# Patient Record
Sex: Female | Born: 1995 | Hispanic: No | Smoking: Never smoker
Health system: Southern US, Community
[De-identification: ages and names within clinical notes are randomized; demographics above are authoritative.]

## PROBLEM LIST (undated history)

## (undated) ENCOUNTER — Inpatient Hospital Stay (HOSPITAL_COMMUNITY): Payer: Self-pay

## (undated) DIAGNOSIS — Z789 Other specified health status: Secondary | ICD-10-CM

## (undated) HISTORY — PX: NO PAST SURGERIES: SHX2092

---
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
## 2015-07-07 IMAGING — US US OB COMP +14 WK
1 series · 12 of 28 positions shown · non-contrast
Comparison: none

[Series 1: us ob comp +14 wk · 12 of 71 slices shown]
[im 3/71]
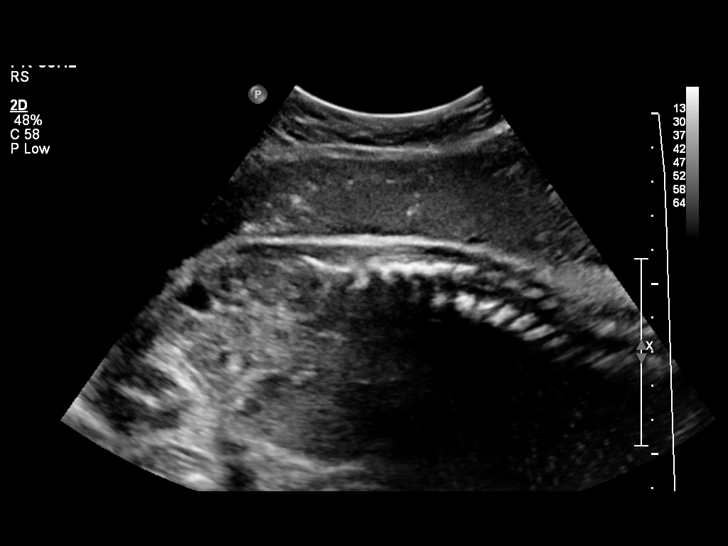
[im 8/71]
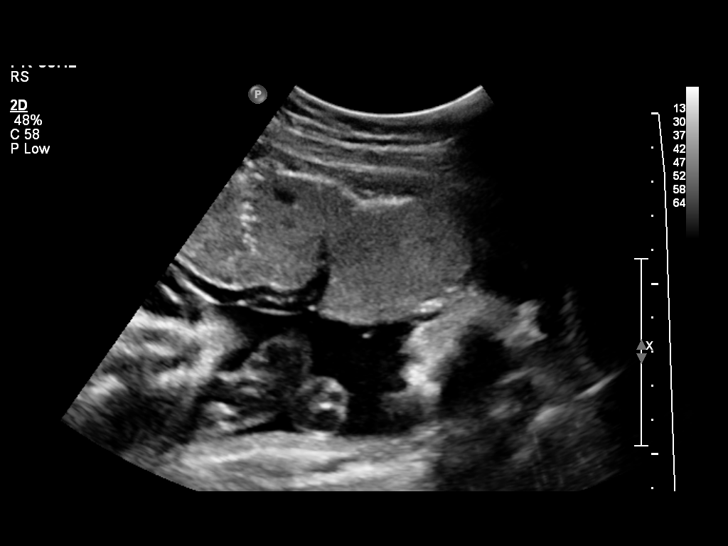
[im 13/71]
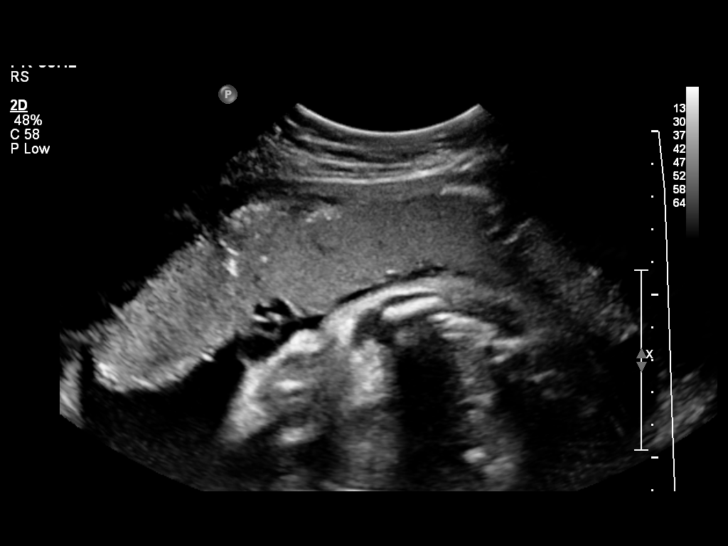
[im 21/71]
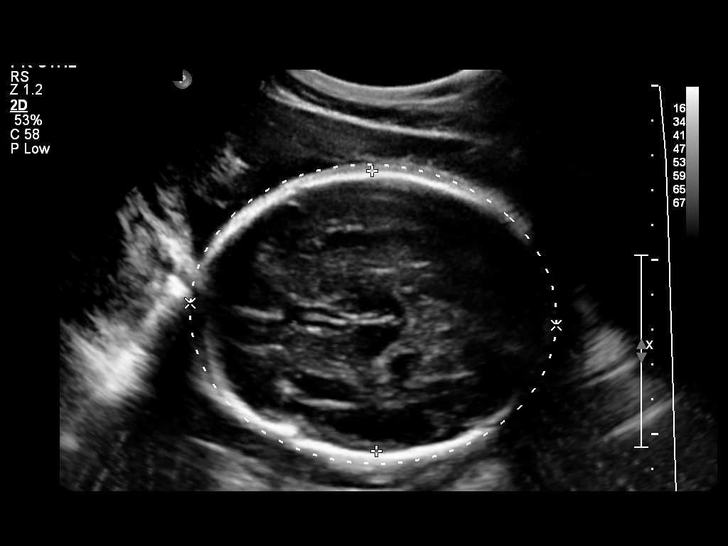
[im 26/71]
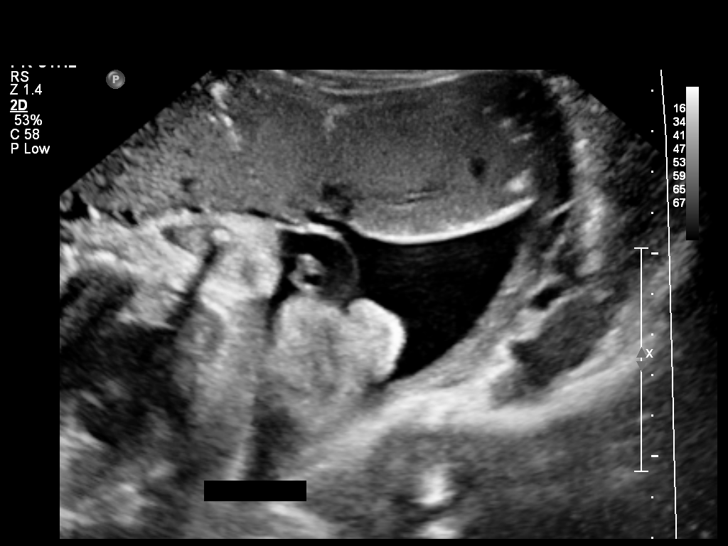
[im 32/71]
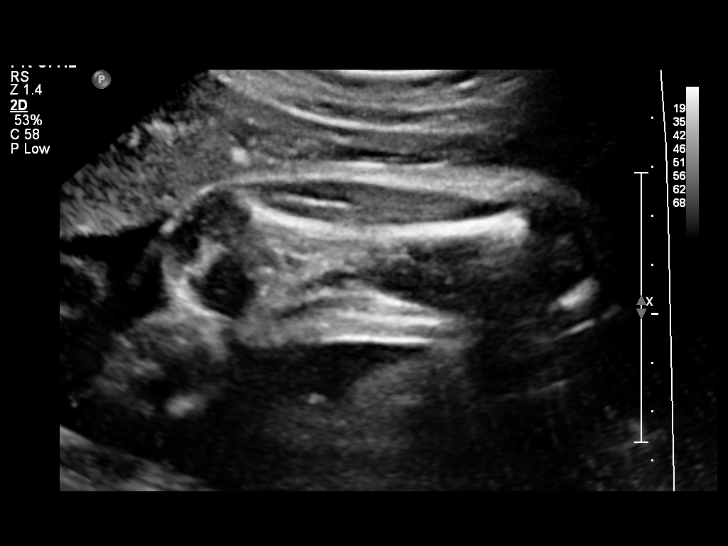
[im 39/71]
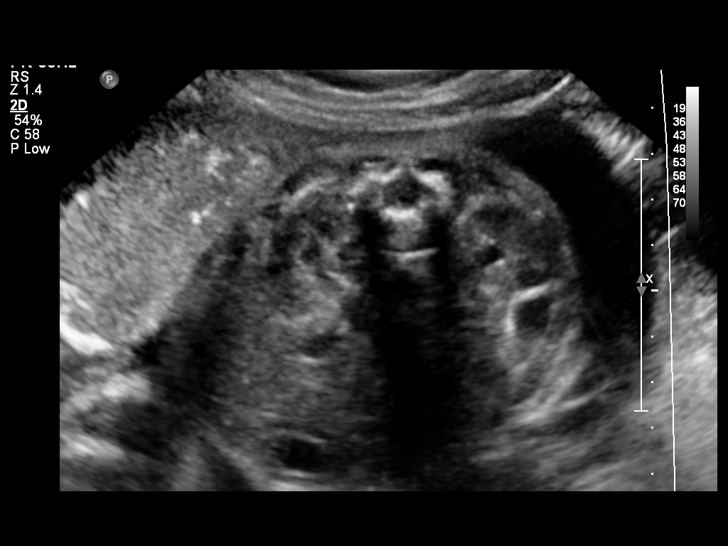
[im 45/71]
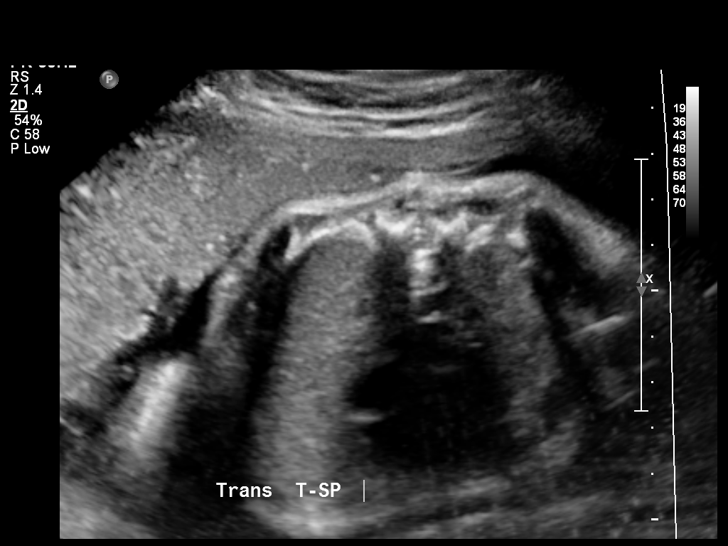
[im 50/71]
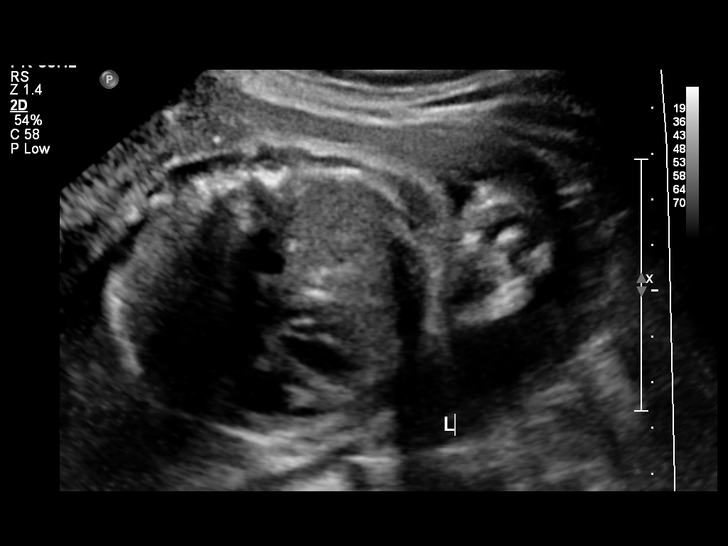
[im 58/71]
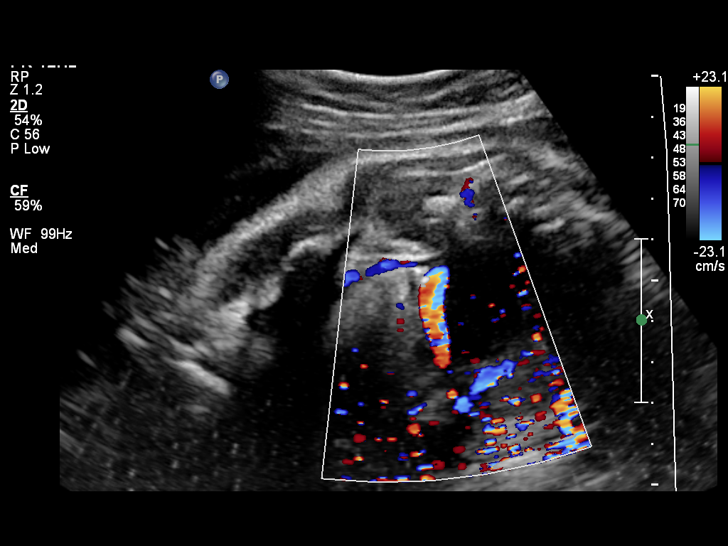
[im 63/71]
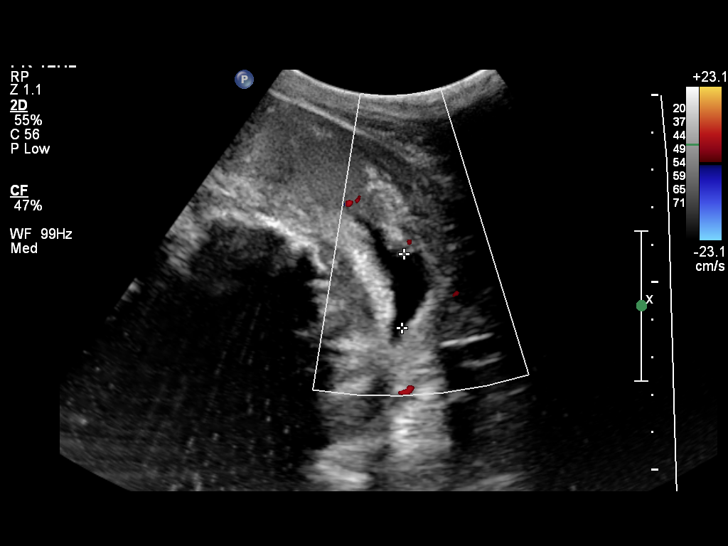
[im 68/71]
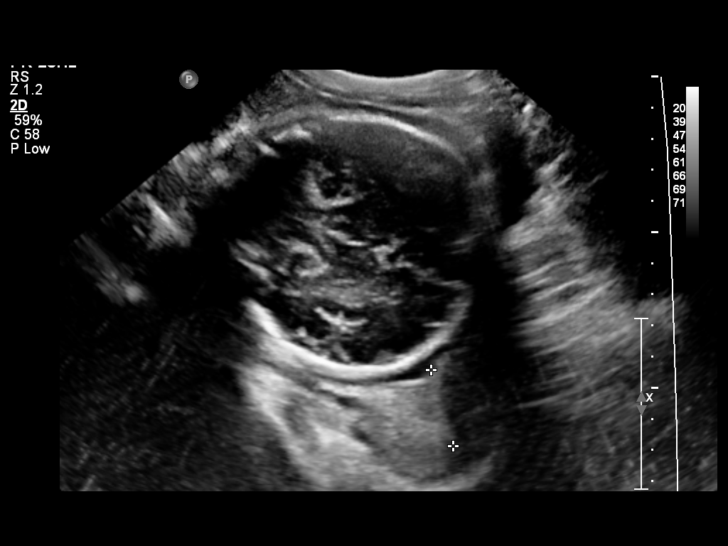

[12 of 28 positions shown; findings below may reference images not displayed]

OBSTETRICS REPORT
                      (Signed Final 11/24/2013 [DATE])

Service(s) Provided

 US OB COMP + 14 WK                                    76805.1
Indications

 No or Little Prenatal Care
 Advanced cervical dilatation
 Unsure of LMP;  Establish Gestational [AGE]
 Abdominal pain - generalized
Fetal Evaluation

 Num Of Fetuses:    1
 Fetal Heart Rate:  139                          bpm
 Cardiac Activity:  Observed
 Presentation:      Cephalic
 Placenta:          Anterior, above cervical os
 P. Cord            Visualized
 Insertion:

 Amniotic Fluid
 AFI FV:      Subjectively within normal limits
 AFI Sum:     12.57   cm       36  %Tile     Larg Pckt:    4.21  cm
 RUQ:   3.25    cm   RLQ:    4.21   cm    LUQ:   3.15    cm   LLQ:    1.96   cm
Biometry

 BPD:     80.5  mm     G. Age:  32w 2d                CI:         73.9   70 - 86
                                                      FL/HC:      19.4   19.3 -

 HC:     297.4  mm     G. Age:  33w 0d       54  %    HC/AC:      1.15   0.96 -

 AC:     257.9  mm     G. Age:  30w 0d       11  %    FL/BPD:     71.8   71 - 87
 FL:      57.8  mm     G. Age:  30w 2d       11  %    FL/AC:      22.4   20 - 24
 HUM:     53.1  mm     G. Age:  31w 0d       41  %

 Est. FW:    4430  gm      3 lb 8 oz     37  %
Gestational Age

 LMP:           21w 0d        Date:  06/30/13                 EDD:   04/06/14
 U/S Today:     31w 3d                                        EDD:   01/23/14
 Best:          31w 3d     Det. By:  U/S (11/24/13)           EDD:   01/23/14
Anatomy
 Cranium:          Appears normal         Aortic Arch:      Not well visualized
 Fetal Cavum:      Appears normal         Ductal Arch:      Not well visualized
 Ventricles:       Appears normal         Diaphragm:        Appears normal
 Choroid Plexus:   Not well visualized    Stomach:          Appears normal, left
                                                            sided
 Cerebellum:       Appears normal         Abdomen:          Not well visualized
 Posterior Fossa:  Not well visualized    Abdominal Wall:   Not well visualized
 Nuchal Fold:      Not applicable (>20    Cord Vessels:     Appears normal (3
                   wks GA)                                  vessel cord)
 Face:             Not well visualized    Kidneys:          Appear normal
 Lips:             Appears normal         Bladder:          Appears normal
 Heart:            Not well visualized    Spine:            Appears normal
 RVOT:             Not well visualized    Lower             Visualized
                                          Extremities:
 LVOT:             Not well visualized    Upper             Visualized
                                          Extremities:

 Other:  Technically difficult due to advanced GA and fetal position.
Cervix Uterus Adnexa

 Cervical Length:    2.98     cm

 Cervix:       Normal appearance by transabdominal scan.
 Uterus:       No abnormality visualized.
 Cul De Sac:   No free fluid seen.
 Left Ovary:    Not visualized.
 Right Ovary:   Not visualized.

 Adnexa:     No abnormality visualized.
Impression

 Single IUP at 31w 3d
 Limited/ late prenatal care, advanced cervical dilation
 Limited views of the fetal anatomy were obtained due to late
 gestational age
 No gross anomalies were noted
 Fetal growth appears appropriate (37th %tile).  The AC
 measures at the 11th %tile.
 Normal amniotic fluid volume
Recommendations

 Recommend follow-up ultrasound examination in 3-4 weeks if
 undelivered for growth and to reevaluate the fetal anatomy

 questions or concerns.

## 2015-07-07 IMAGING — US US ABDOMEN LIMITED
1 series · 14 of 25 positions shown · non-contrast
Comparison: None.

CLINICAL DATA: Abdominal pain and diarrhea

EXAM:
US ABDOMEN LIMITED - RIGHT UPPER QUADRANT

[Series 1: us abdomen limited ruq/ascites · 14 of 40 slices shown]
[im 1/40]
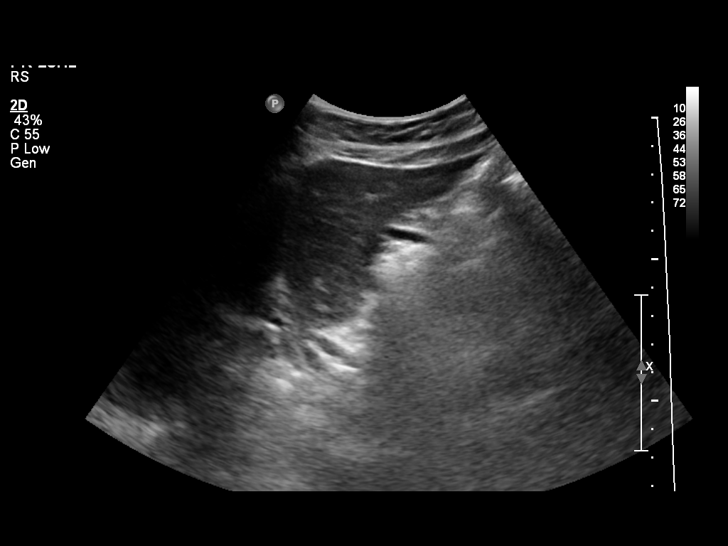
[im 4/40]
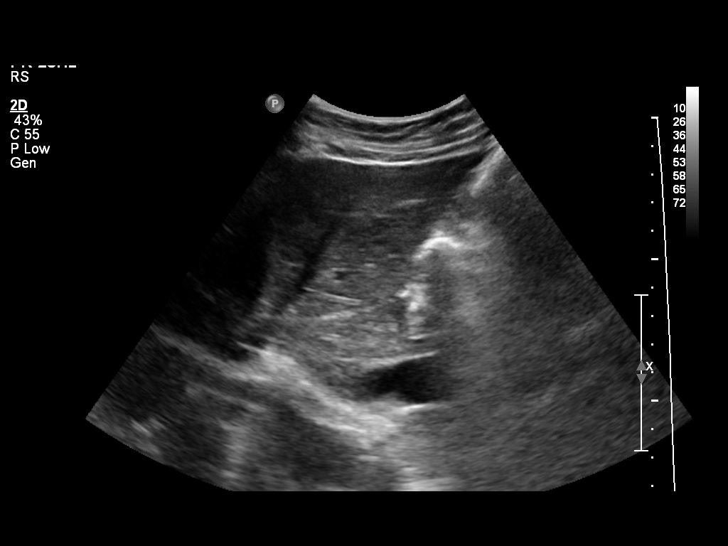
[im 7/40]
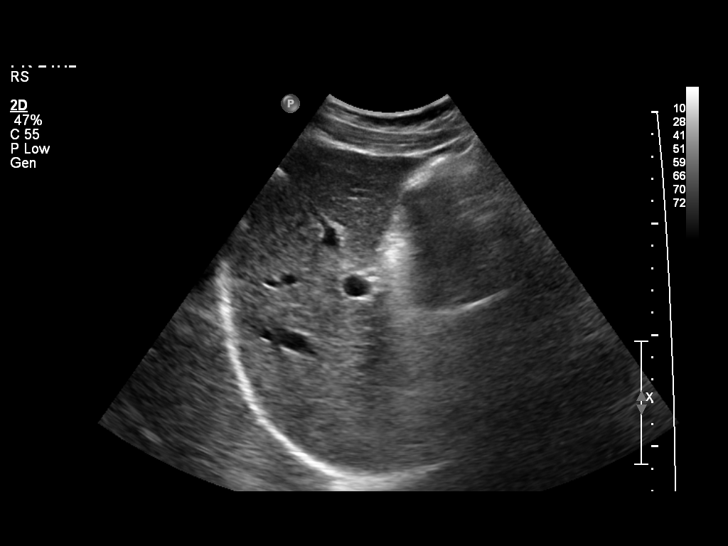
[im 10/40]
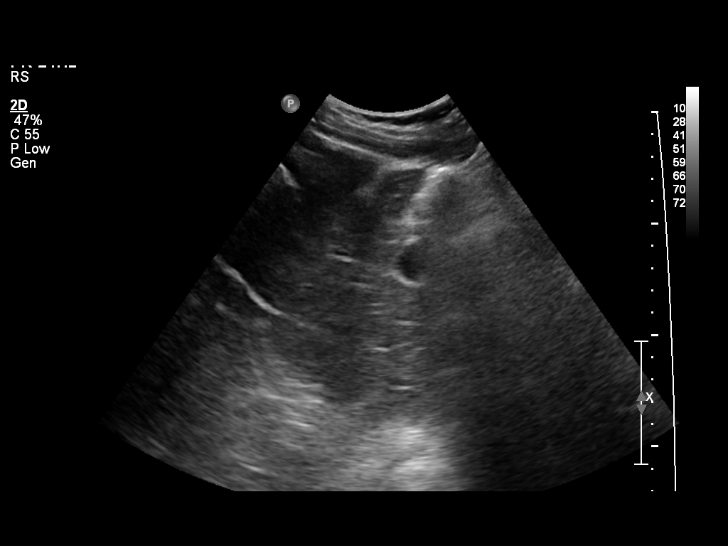
[im 14/40]
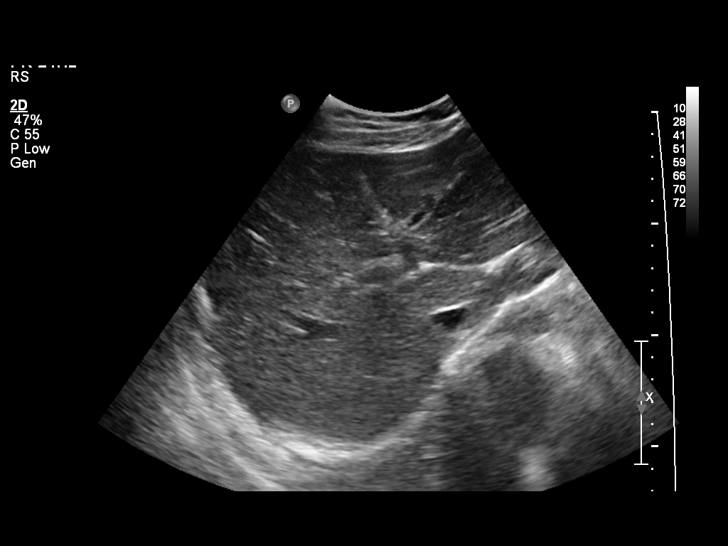
[im 15/40]
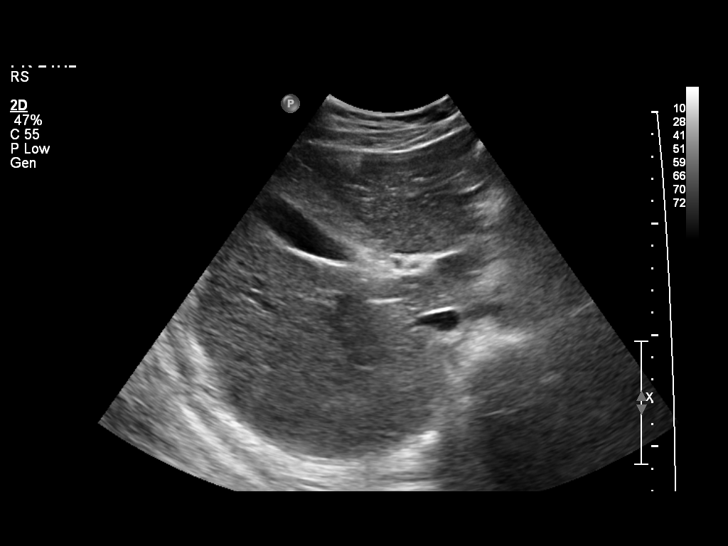
[im 18/40]
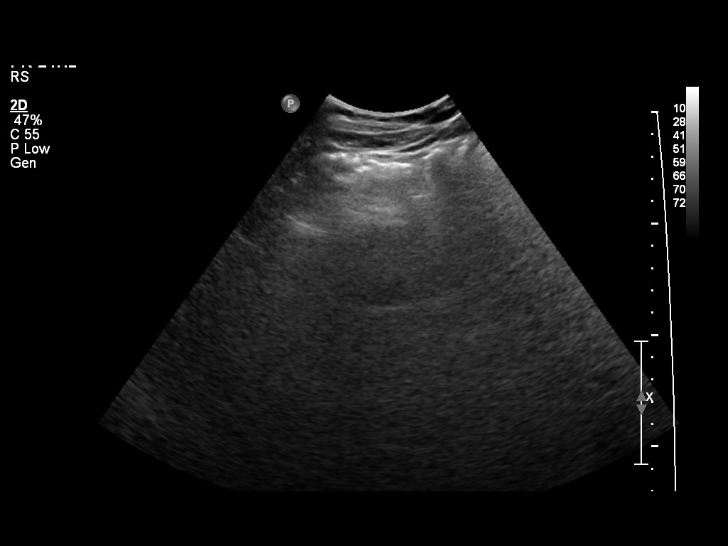
[im 22/40]
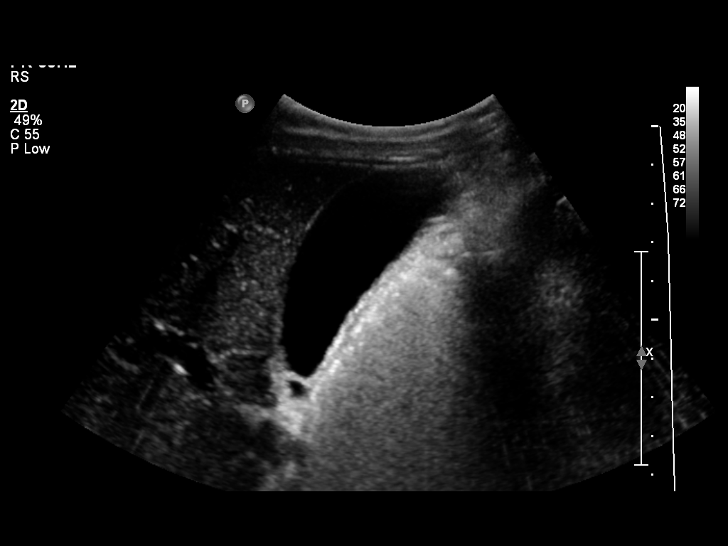
[im 25/40]
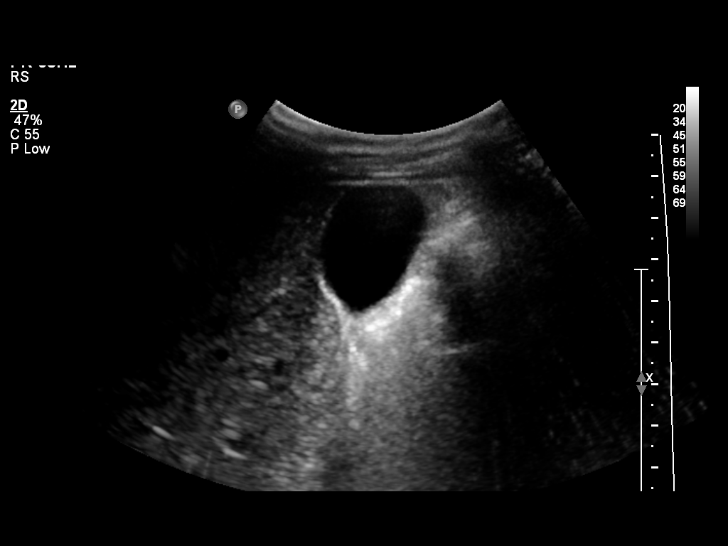
[im 27/40]
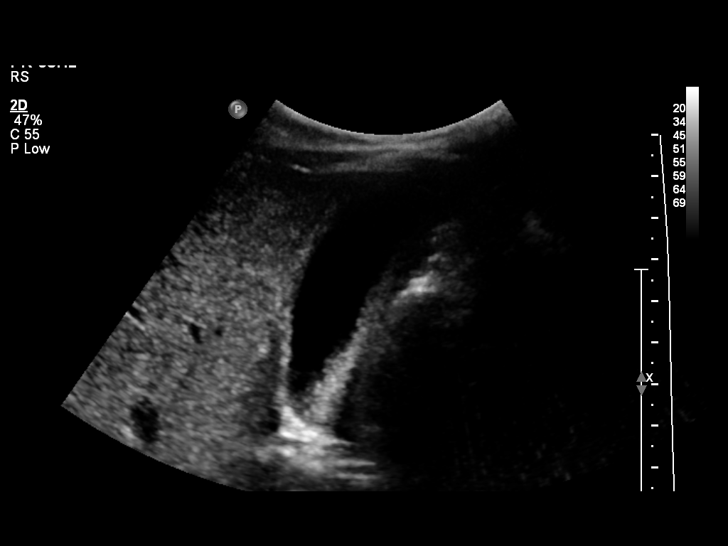
[im 30/40]
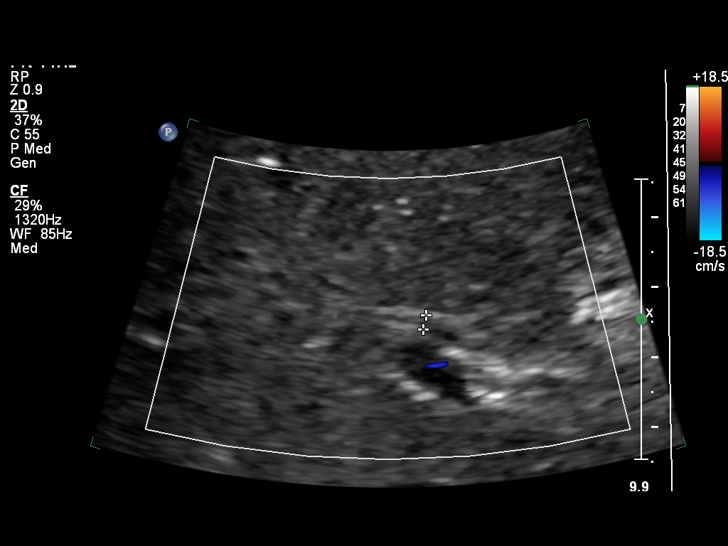
[im 33/40]
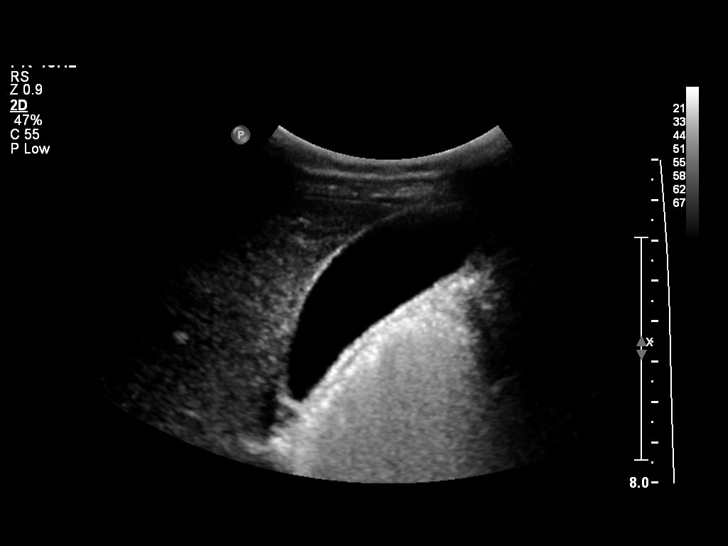
[im 36/40]
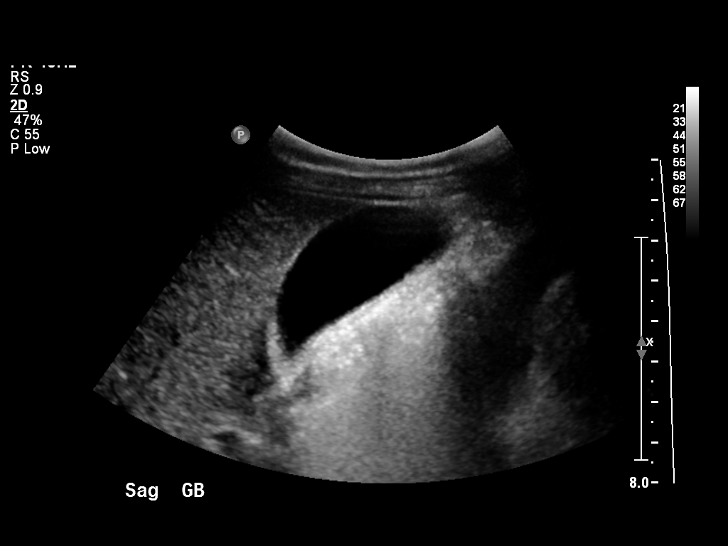
[im 40/40]
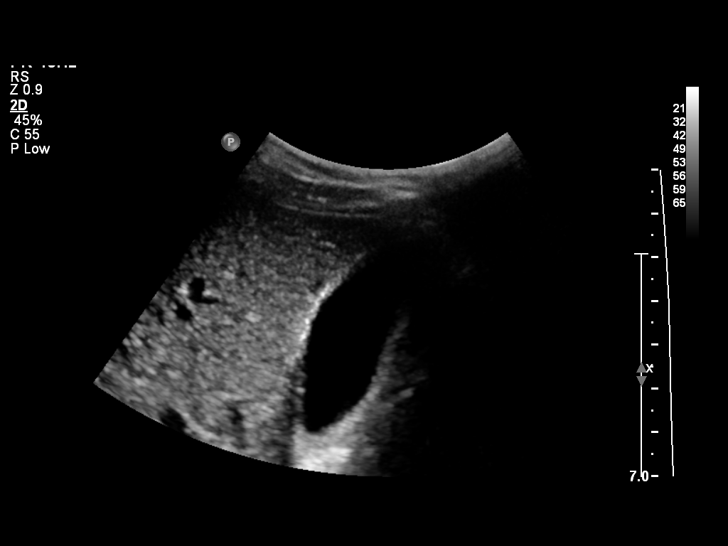

[14 of 25 positions shown; findings below may reference images not displayed]

FINDINGS: Gallbladder:

No gallstones or wall thickening visualized. There is no
pericholecystic fluid. No sonographic Murphy sign noted.

Common bile duct:

Diameter: 2 mm. There is no intrahepatic or extrahepatic biliary
duct dilatation.

Liver:

No focal lesion identified. Within normal limits in parenchymal
echogenicity.
IMPRESSION: Study within normal limits.

## 2015-08-20 IMAGING — US US OB FOLLOW-UP
1 series · 12 of 28 positions shown · non-contrast
Comparison: none

[Series 1: us ob follow up · 65 acquisitions, 12 frames shown]
[im 3/65]
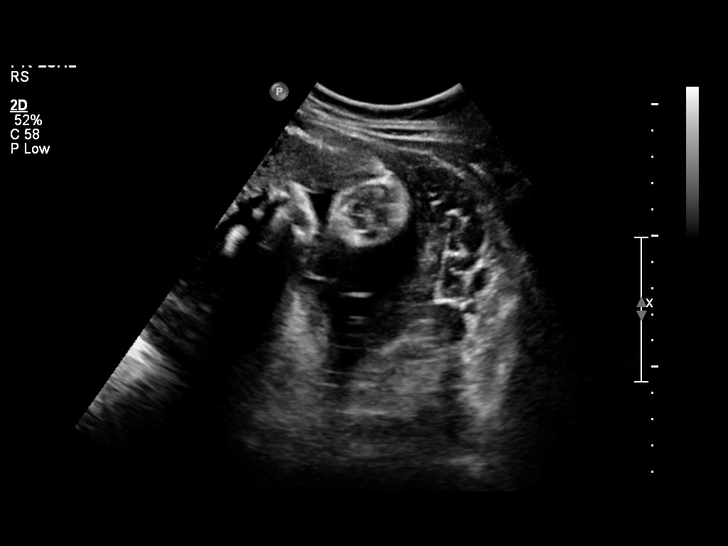
[im 8/65]
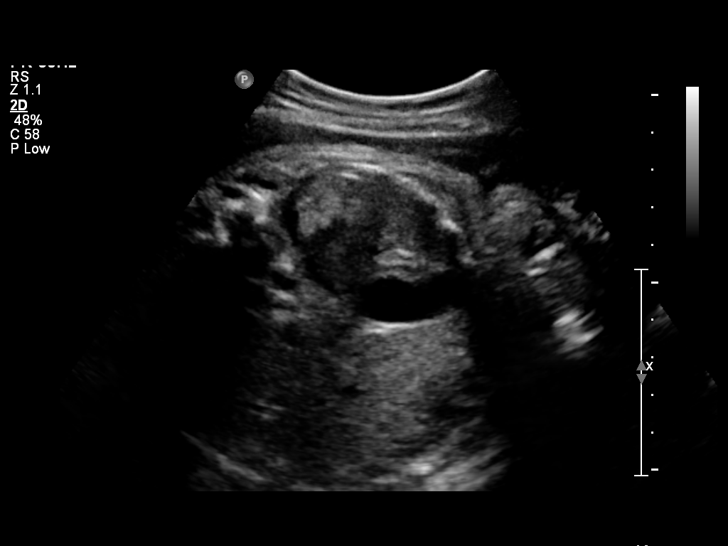
[im 12/65]
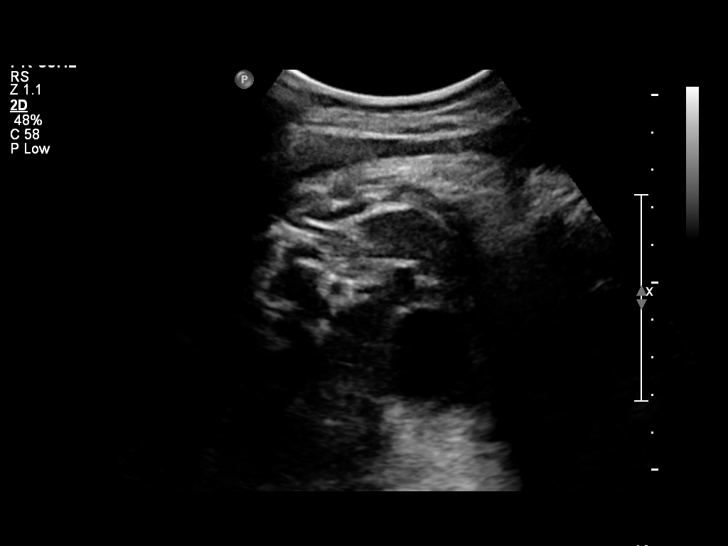
[im 19/65]
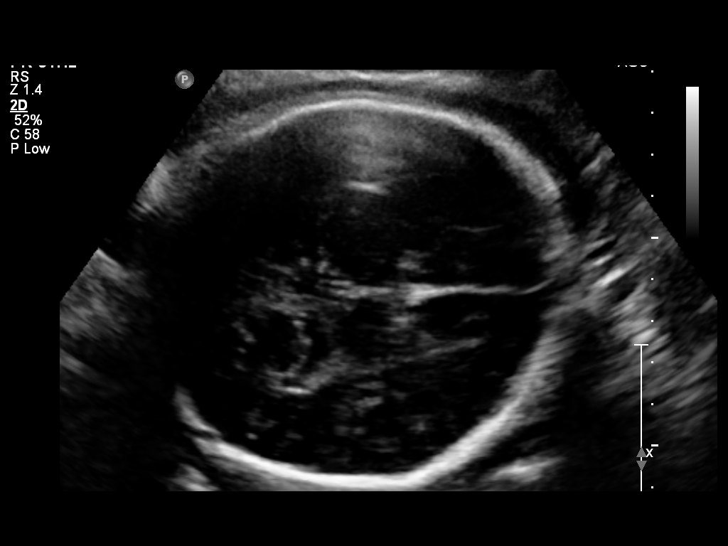
[im 24/65]
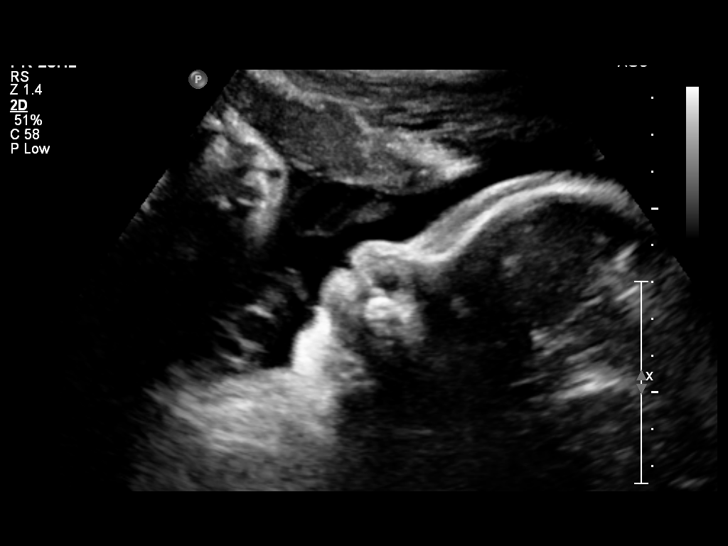
[im 29/65]
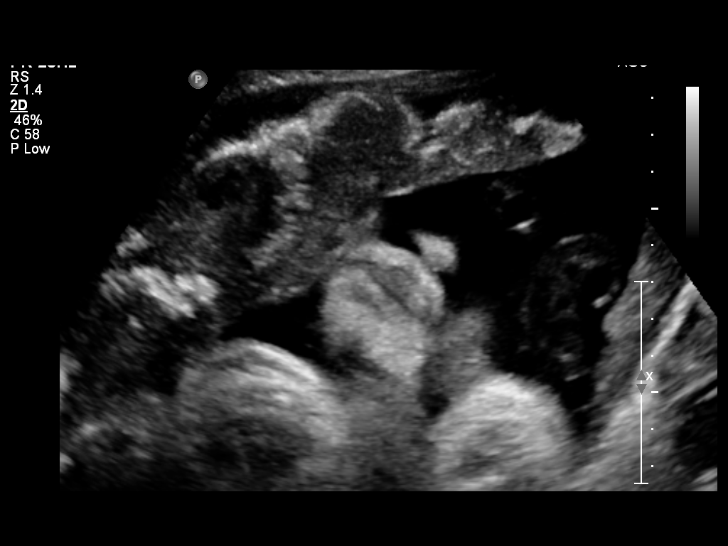
[im 36/65]
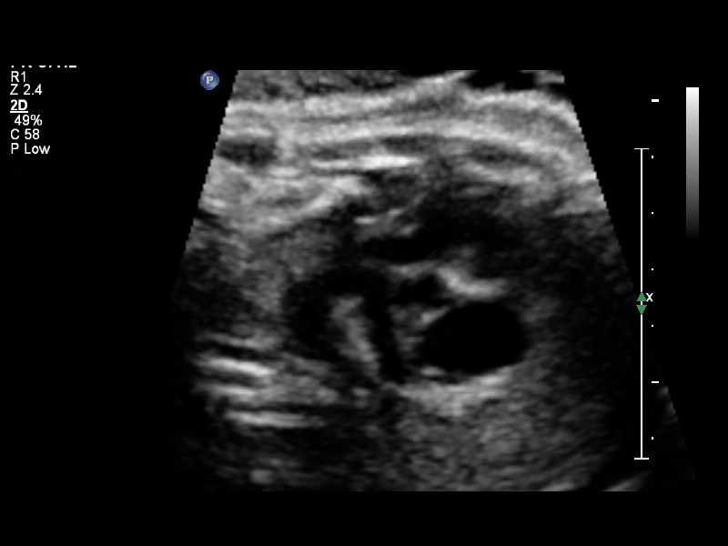
[im 41/65]
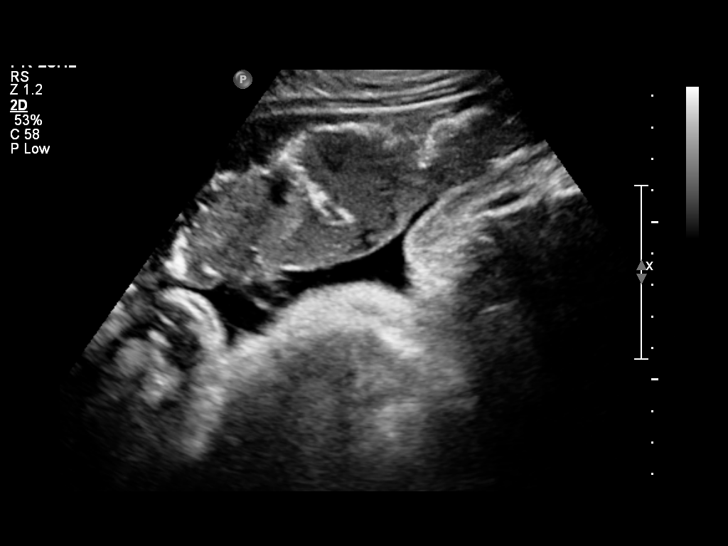
[im 46/65]
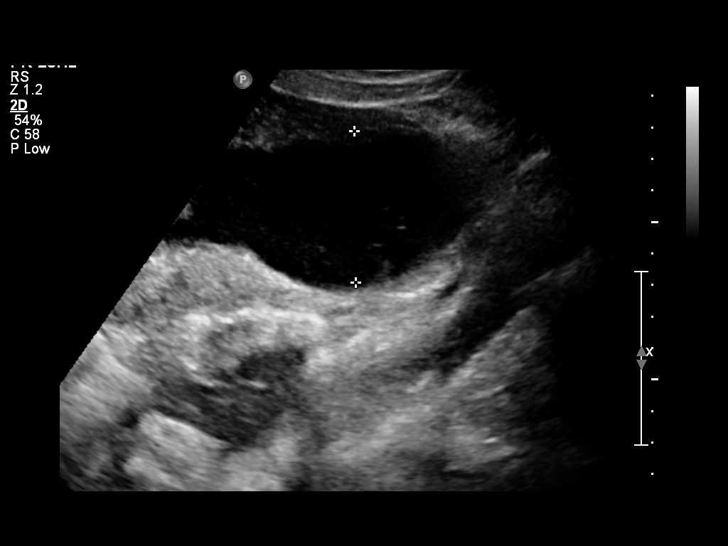
[im 53/65]
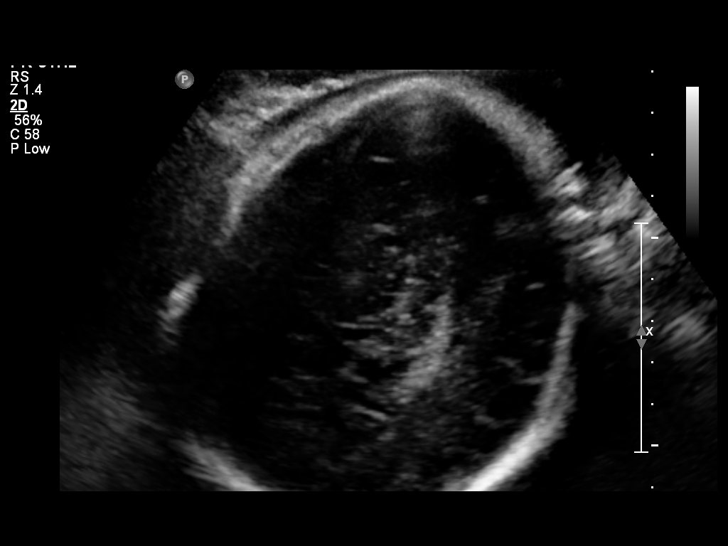
[im 57/65]
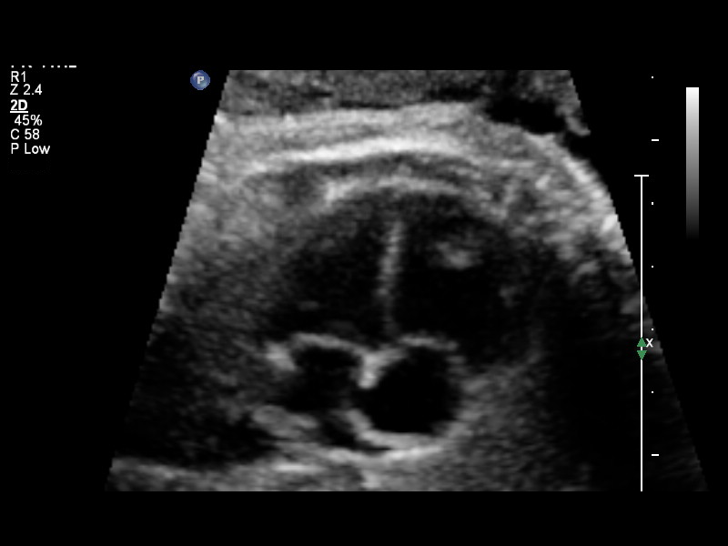
[im 62/65]
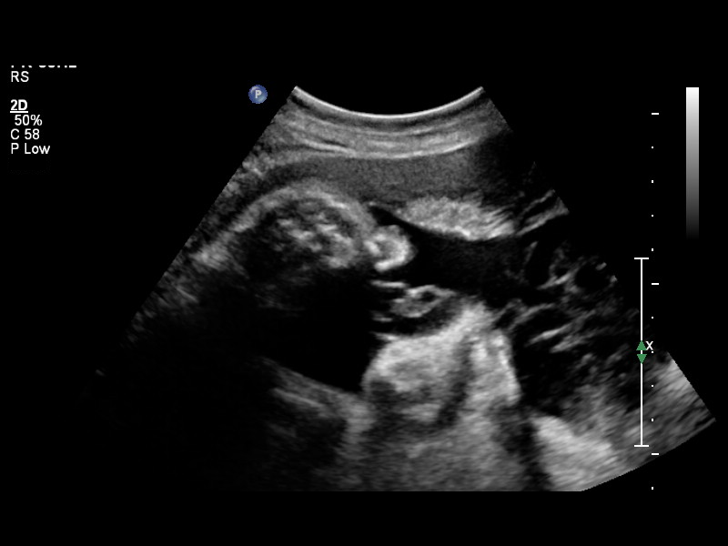

[12 of 28 positions shown; findings below may reference images not displayed]

OBSTETRICS REPORT
                      (Signed Final 01/07/2014 [DATE])

Service(s) Provided

 US OB FOLLOW UP                                       76816.1
Indications

 No or Little Prenatal Care
 Advanced cervical dilatation
 Teen pregnancy
 Follow-up incomplete fetal anatomic evaluation
Fetal Evaluation

 Num Of Fetuses:    1
 Fetal Heart Rate:  134                          bpm
 Cardiac Activity:  Observed
 Presentation:      Cephalic
 Placenta:          Anterior, above cervical os
 P. Cord            Previously Visualized
 Insertion:

 Amniotic Fluid
 AFI FV:      Subjectively within normal limits
 AFI Sum:     13.1    cm       49  %Tile     Larg Pckt:    4.79  cm
 RUQ:   1.25    cm   RLQ:    3.67   cm    LUQ:   3.39    cm   LLQ:    4.79   cm
Biometry

 BPD:     88.6  mm     G. Age:  35w 6d                CI:        76.05   70 - 86
                                                      FL/HC:      22.1   20.9 -

 HC:       322  mm     G. Age:  36w 3d        8  %    HC/AC:      0.90   0.92 -

 AC:     356.8  mm     G. Age:  39w 4d       96  %    FL/BPD:     80.2   71 - 87
 FL:      71.1  mm     G. Age:  36w 3d       22  %    FL/AC:      19.9   20 - 24

 Est. FW:    7787  gm      7 lb 7 oz     81  %
Gestational Age

 LMP:           27w 2d        Date:  06/30/13                 EDD:   04/06/14
 U/S Today:     37w 1d                                        EDD:   01/27/14
 Best:          37w 5d     Det. By:  U/S (11/24/13)           EDD:   01/23/14
Anatomy
 Cranium:          Appears normal         Aortic Arch:      Not well visualized
 Fetal Cavum:      Appears normal         Ductal Arch:      Not well visualized
 Ventricles:       Appears normal         Diaphragm:        Appears normal
 Choroid Plexus:   Appears normal         Stomach:          Appears normal, left
                                                            sided
 Cerebellum:       Previously seen        Abdomen:          Appears normal
 Posterior Fossa:  Not well visualized    Abdominal Wall:   Not well visualized
 Nuchal Fold:      Not applicable (>20    Cord Vessels:     Appears normal (3
                   wks GA)                                  vessel cord)
 Face:             Appears normal         Kidneys:          Appear normal
                   (orbits and profile)
 Lips:             Appears normal         Bladder:          Appears normal
 Heart:            Appears normal         Spine:            Previously seen
                   (4CH, axis, and
                   situs)
 RVOT:             Appears normal         Lower             Previously seen
                                          Extremities:
 LVOT:             Appears normal         Upper             Previously seen
                                          Extremities:

 Other:  Technically difficult due to advanced GA and fetal position. Fetus
         appears to be a male.
Cervix Uterus Adnexa

 Cervix:       Not visualized (advanced GA >97wks)

 Adnexa:     No abnormality visualized.
Impression

 SIUP at 37+5 weeks
 Normal interval anatomy; limited views of PF, arches and CI
 Normal amniotic fluid volume
 Appropriate interval growth with EFW at the 81st %tile
Recommendations

 Follow-up as clinically indicated

 questions or concerns.

## 2019-04-30 ENCOUNTER — Other Ambulatory Visit: Payer: Self-pay

## 2019-04-30 DIAGNOSIS — Z20822 Contact with and (suspected) exposure to covid-19: Secondary | ICD-10-CM

## 2019-05-02 LAB — NOVEL CORONAVIRUS, NAA: SARS-CoV-2, NAA: NOT DETECTED

## 2019-05-07 ENCOUNTER — Other Ambulatory Visit: Payer: Self-pay

## 2019-05-07 ENCOUNTER — Ambulatory Visit (INDEPENDENT_AMBULATORY_CARE_PROVIDER_SITE_OTHER)
Admission: RE | Admit: 2019-05-07 | Discharge: 2019-05-07 | Disposition: A | Discharge status: Home / Self Care | Payer: Self-pay | Source: Ambulatory Visit

## 2019-05-07 DIAGNOSIS — Z20822 Contact with and (suspected) exposure to covid-19: Secondary | ICD-10-CM

## 2019-05-07 DIAGNOSIS — J069 Acute upper respiratory infection, unspecified: Secondary | ICD-10-CM

## 2019-05-07 DIAGNOSIS — J029 Acute pharyngitis, unspecified: Secondary | ICD-10-CM

## 2019-05-07 DIAGNOSIS — B9789 Other viral agents as the cause of diseases classified elsewhere: Secondary | ICD-10-CM

## 2019-05-07 DIAGNOSIS — Z20828 Contact with and (suspected) exposure to other viral communicable diseases: Secondary | ICD-10-CM

## 2019-05-07 MED ORDER — CETIRIZINE HCL 10 MG PO TABS: 10.0000 mg | ORAL_TABLET | Freq: Every day | ORAL | 0 refills | Status: AC

## 2019-05-07 NOTE — ED Provider Notes (Signed)
[place] Virtual Visit via Video Note:  Annette Mccall  initiated request for Telemedicine visit with [facility] team. I connected with Annette Mccall  on 05/07/2019 at 1:04 PM  for a synchronized telemedicine visit using a video enabled HIPPA compliant telemedicine application. I verified that I am speaking with Annette Mccall  using two identifiers. Lestine Box, PA-C  was physically located in a [facility] Urgent care site and Annette Mccall was located at a different location.   The limitations of evaluation and management by telemedicine as well as the availability of in-person appointments were discussed. Patient was informed that she  may incur a bill ( including co-pay) for this virtual visit encounter. Annette Mccall  expressed understanding and gave verbal consent to proceed with virtual visit.   [id number] 05/07/19 Arrival Time: 7829  CC: COVID symptoms  SUBJECTIVE: History from: patient.  Annette Mccall is a 23 y.o. female who presents with congestion, fatigue x 1 week.  Was tested for COVID last week and was negative.  However, this morning experienced worsening congestion, sore throat, loss of taste and smell.  Denies sick exposure to COVID, flu or strep.  Denies recent travel.  Has tried OTC theraflu and motrin with temporary relief.  Denies aggravating factors.  Reports previous symptoms in the past with a cold.   Denies fever, chills, rhinorrhea, cough, SOB, wheezing, chest pain, nausea, vomiting, changes in bowel or bladder habits.    ROS: As per HPI.  All other pertinent ROS negative.     Past Medical History:  Diagnosis Date  . Medical history non-contributory    Past Surgical History:  Procedure Laterality Date  . NO PAST SURGERIES     No Known Allergies No current facility-administered medications on file prior to encounter.   Current Outpatient Medications on File Prior to Encounter  Medication Sig Dispense Refill  . Prenatal Vit-Fe  Fumarate-FA (PRENATAL MULTIVITAMIN) TABS tablet Take 1 tablet by mouth daily at 12 noon.      OBJECTIVE:   There were no vitals filed for this visit.  General appearance: alert; no distress Eyes: EOMI grossly HENT: normocephalic; atraumatic Neck: supple with FROM Lungs: normal respiratory effort; speaking in full sentences without difficulty Extremities: moves extremities without difficulty Skin: No obvious rashes Neurologic: No facial asymmetries Psychological: alert and cooperative; normal mood and affect  ASSESSMENT & PLAN:  1. Suspected COVID-19 virus infection   2. Viral URI     Meds ordered this encounter  Medications  . cetirizine (ZYRTEC) 10 MG tablet    Sig: Take 1 tablet (10 mg total) by mouth daily.    Dispense:  30 tablet    Refill:  0    Order Specific Question:   Supervising Provider    Answer:   Raylene Everts [[id number]]    COVID testing ordered.  You may text "COVID" to [phone], call [phone], OR  log on to [email or web address] to make an appointment  In the meantime: You should remain isolated in your home for 10 days from symptom onset AND greater than 72 hours after symptoms resolution (absence of fever without the use of fever-reducing medication and improvement in respiratory symptoms), whichever is longer Get plenty of rest and push fluids Zyrtec prescribed for nasal congestion, runny nose, and/or sore throat You may use OTC flonase as needed for nasal congestion and runny nose Use medications daily for symptom relief Use OTC medications like ibuprofen or tylenol as needed fever or pain Call or go  to the ED if you have any new or worsening symptoms such as fever, cough, shortness of breath, chest tightness, chest pain, turning blue, changes in mental status, etc...  I discussed the assessment and treatment plan with the patient. The patient was provided an opportunity to ask questions and all were answered. The patient agreed with the plan and  demonstrated an understanding of the instructions.   The patient was advised to call back or seek an in-person evaluation if the symptoms worsen or if the condition fails to improve as anticipated.  I provided 10 minutes of non-face-to-face time during this encounter.  Rennis Harding, PA-C  05/07/2019 1:04 PM          Rennis Harding, PA-C 05/07/19 1307

## 2019-05-07 NOTE — Discharge Instructions (Addendum)
COVID testing ordered.  You may text "COVID" to [phone], call [phone], OR  log on to [email or web address] to make an appointment  In the meantime: You should remain isolated in your home for 10 days from symptom onset AND greater than 72 hours after symptoms resolution (absence of fever without the use of fever-reducing medication and improvement in respiratory symptoms), whichever is longer Get plenty of rest and push fluids Zyrtec prescribed for nasal congestion, runny nose, and/or sore throat You may use OTC flonase as needed for nasal congestion and runny nose Use medications daily for symptom relief Use OTC medications like ibuprofen or tylenol as needed fever or pain Call or go to the ED if you have any new or worsening symptoms such as fever, cough, shortness of breath, chest tightness, chest pain, turning blue, changes in mental status, etc..Marland Kitchen

## 2019-05-08 ENCOUNTER — Ambulatory Visit: Payer: Self-pay | Attending: Internal Medicine

## 2019-05-08 DIAGNOSIS — Z20822 Contact with and (suspected) exposure to covid-19: Secondary | ICD-10-CM

## 2019-05-09 LAB — NOVEL CORONAVIRUS, NAA: SARS-CoV-2, NAA: DETECTED — AB

## 2019-05-11 ENCOUNTER — Telehealth: Payer: Self-pay

## 2019-05-13 ENCOUNTER — Other Ambulatory Visit: Payer: Self-pay

## 2024-05-18 ENCOUNTER — Ambulatory Visit: Payer: Self-pay

## 2024-05-19 ENCOUNTER — Ambulatory Visit
Admission: RE | Admit: 2024-05-19 | Discharge: 2024-05-19 | Disposition: A | Discharge status: Home / Self Care | Source: Home / Self Care

## 2024-05-19 VITALS — BP 109/71 | HR 98 | Temp 99.2°F | Resp 18

## 2024-05-19 DIAGNOSIS — H6121 Impacted cerumen, right ear: Secondary | ICD-10-CM

## 2024-05-19 DIAGNOSIS — R6889 Other general symptoms and signs: Secondary | ICD-10-CM

## 2024-05-19 LAB — POCT INFLUENZA A/B
Influenza A, POC: NEGATIVE
Influenza B, POC: NEGATIVE

## 2024-05-19 MED ORDER — PROMETHAZINE-DM 6.25-15 MG/5ML PO SYRP: 5.0000 mL | ORAL_SOLUTION | Freq: Four times a day (QID) | ORAL | 0 refills | Status: AC | PRN

## 2024-05-19 NOTE — ED Provider Notes (Signed)
 " [facility]    CSN: [id number] Arrival date & time: 05/19/24  1826      History   Chief Complaint Chief Complaint  Patient presents with   Cough    I've had chills for two days but now my ear is starting to hurt and I'm concerned I have an ear infection faction. I did have a fever. Now I have congestion and coughing. - Entered by patient   Nasal Congestion   Rash   Otalgia   Nausea   Vomiting   Fever    HPI Kaede Clendenen is a 28 y.o. female.   28 year old female, Malini Flemings, presents to urgent care for evaluation of feeling sick x 3 days: complaining of cough, congestion, right ear pain, rash,nausea, vomiting and fever;  patient took at home COVID test that was negative yesterday. Pt sister recently had similar illness, visited with at Christmas.  Patient is able to tolerate Pedialyte, soup, no solids x 2 days.  LMP 05/17/24, patient is not breast-feeding.    The history is provided by the patient. No language interpreter was used.  Cough Associated symptoms: ear pain, fever and rash   Rash Associated symptoms: fever, nausea and vomiting   Associated symptoms: no abdominal pain and no diarrhea   Otalgia Associated symptoms: congestion, cough, fever, rash and vomiting   Associated symptoms: no abdominal pain and no diarrhea   Fever Associated symptoms: congestion, cough, ear pain, nausea, rash and vomiting   Associated symptoms: no diarrhea     Past Medical History:  Diagnosis Date   Medical history non-contributory     Patient Active Problem List   Diagnosis Date Noted   Impacted cerumen of right ear 05/19/2024   Flu-like symptoms 05/19/2024   Active labor 01/19/2014   Late prenatal care affecting pregnancy at [redacted]w[redacted]d 12/14/2013   Anemia affecting pregnancy 12/02/2013    Past Surgical History:  Procedure Laterality Date   NO PAST SURGERIES      OB History     Gravida  2   Para  2   Term  2   Preterm      AB      Living  2       SAB      IAB      Ectopic      Multiple      Live Births  2            Home Medications    Prior to Admission medications  Medication Sig Start Date End Date Taking? Authorizing Provider  promethazine-dextromethorphan (PROMETHAZINE-DM) 6.25-15 MG/5ML syrup Take 5 mLs by mouth 4 (four) times daily as needed for cough. 05/19/24  Yes Boby Eyer, NP  cetirizine  (ZYRTEC ) 10 MG tablet Take 1 tablet (10 mg total) by mouth daily. 05/07/19   Wurst, Brittany, PA-C  Prenatal Vit-Fe Fumarate-FA (PRENATAL MULTIVITAMIN) TABS tablet Take 1 tablet by mouth daily at 12 noon.    [provider]    Family History History reviewed. No pertinent family history.  Social History Social History[1]   Allergies   Patient has no known allergies.   Review of Systems Review of Systems  Constitutional:  Positive for fever.  HENT:  Positive for congestion and ear pain.   Respiratory:  Positive for cough.   Gastrointestinal:  Positive for nausea and vomiting. Negative for abdominal pain and diarrhea.  Skin:  Positive for rash.  All other systems reviewed and are negative.    Physical Exam  Triage Vital Signs ED Triage Vitals  Encounter Vitals Group     BP 05/19/24 1839 109/71     Girls Systolic BP Percentile --      Girls Diastolic BP Percentile --      Boys Systolic BP Percentile --      Boys Diastolic BP Percentile --      Pulse Rate 05/19/24 1839 98     Resp 05/19/24 1839 18     Temp 05/19/24 1839 99.2 F (37.3 C)     Temp Source 05/19/24 1839 Oral     SpO2 05/19/24 1839 94 %     Weight --      Height --      Head Circumference --      Peak Flow --      Pain Score 05/19/24 1838 6     Pain Loc --      Pain Education --      Exclude from Growth Chart --    No data found.  Updated Vital Signs BP 109/71 (BP Location: Right Arm)   Pulse 98   Temp 99.2 F (37.3 C) (Oral)   Resp 18   LMP 05/17/2024   SpO2 94%   Breastfeeding No   Visual  Acuity Right Eye Distance:   Left Eye Distance:   Bilateral Distance:    Right Eye Near:   Left Eye Near:    Bilateral Near:     Physical Exam Vitals and nursing note reviewed.  Constitutional:      General: She is not in acute distress.    Appearance: She is well-developed and well-groomed.  HENT:     Head: Normocephalic.     Right Ear: Tympanic membrane is retracted.     Left Ear: Tympanic membrane is retracted.     Nose: Congestion present.     Mouth/Throat:     Lips: Pink.     Mouth: Mucous membranes are moist.     Pharynx: Oropharynx is clear.  Eyes:     General: Lids are normal.     Conjunctiva/sclera: Conjunctivae normal.     Pupils: Pupils are equal, round, and reactive to light.  Neck:     Trachea: No tracheal deviation.  Cardiovascular:     Rate and Rhythm: Normal rate and regular rhythm.     Heart sounds: Normal heart sounds. No murmur heard. Pulmonary:     Effort: Pulmonary effort is normal.     Breath sounds: Normal breath sounds and air entry.  Abdominal:     General: Bowel sounds are normal.     Palpations: Abdomen is soft.     Tenderness: There is no abdominal tenderness.  Musculoskeletal:        General: Normal range of motion.     Cervical back: Normal range of motion.  Lymphadenopathy:     Cervical: No cervical adenopathy.  Skin:    General: Skin is warm and dry.     Findings: No rash.  Neurological:     General: No focal deficit present.     Mental Status: She is alert and oriented to person, place, and time.     GCS: GCS eye subscore is 4. GCS verbal subscore is 5. GCS motor subscore is 6.  Psychiatric:        Attention and Perception: Attention normal.        Mood and Affect: Mood normal.        Speech: Speech normal.  Behavior: Behavior normal. Behavior is cooperative.      UC Treatments / Results  Labs (all labs ordered are listed, but only abnormal results are displayed) Labs Reviewed  POCT INFLUENZA A/B     EKG   Radiology No results found.  Procedures Procedures (including critical care time)  Medications Ordered in UC Medications - No data to display  Initial Impression / Assessment and Plan / UC Course  I have reviewed the triage vital signs and the nursing notes.  Pertinent labs & imaging results that were available during my care of the patient were reviewed by me and considered in my medical decision making (see chart for details).    Discussed exam findings and plan of care with patient, flu test is negative , Phenergan DM scripted , strict go to ER precautions given.   Patient verbalized understanding to this provider.  Work note given.  Ddx: Flulike illness, viral illness , allergies right cerumen impaction Final Clinical Impressions(s) / UC Diagnoses   Final diagnoses:  Flu-like symptoms  Impacted cerumen of right ear     Discharge Instructions      Flu test is negative Mucinex,sudafed Take phenergan Dm cough med as prescribed Push fluids Please get established with [facility] of your [facility],check with insurance carrier for options. If you develop chest pain,shortness of breath or worsening issues go to Er for further evaluation.      ED Prescriptions     Medication Sig Dispense Auth. Provider   promethazine-dextromethorphan (PROMETHAZINE-DM) 6.25-15 MG/5ML syrup Take 5 mLs by mouth 4 (four) times daily as needed for cough. 118 mL Jeris Easterly, NP      PDMP not reviewed this encounter.     [1]  Social History Tobacco Use   Smoking status: Never   Smokeless tobacco: Never  Substance Use Topics   Alcohol use: No   Drug use: No     Merinda Victorino, [place], NP 05/19/24 1935  "

## 2024-05-19 NOTE — Discharge Instructions (Addendum)
 Flu test is negative Mucinex,sudafed Take phenergan Dm cough med as prescribed Push fluids Please get established with [facility] your [facility],check with insurance carrier for options. If you develop chest pain,shortness of breath or worsening issues go to Er for further evaluation.

## 2024-05-19 NOTE — ED Triage Notes (Signed)
 Patient states since Saturday she has been feeling sick.  Symptoms: congestion, cough, right ear pain, rash to neck and under her eyes, nausea and vomiting and fever  At home covid test was negative yesterday  OTC:  Theraflu, Pedialyte
